# Patient Record
Sex: Female | Born: 1935 | Race: White | Hispanic: No | Smoking: Never smoker
Health system: Southern US, Community
[De-identification: ages and names within clinical notes are randomized; demographics above are authoritative.]

## PROBLEM LIST (undated history)

## (undated) DIAGNOSIS — H3530 Unspecified macular degeneration: Secondary | ICD-10-CM

## (undated) DIAGNOSIS — K5900 Constipation, unspecified: Secondary | ICD-10-CM

## (undated) DIAGNOSIS — E119 Type 2 diabetes mellitus without complications: Secondary | ICD-10-CM

## (undated) DIAGNOSIS — K219 Gastro-esophageal reflux disease without esophagitis: Secondary | ICD-10-CM

## (undated) DIAGNOSIS — E039 Hypothyroidism, unspecified: Secondary | ICD-10-CM

## (undated) HISTORY — PX: CYSTOCELE REPAIR: SHX163

## (undated) HISTORY — DX: Constipation, unspecified: K59.00

## (undated) HISTORY — PX: TOTAL ABDOMINAL HYSTERECTOMY: SHX209

## (undated) HISTORY — DX: Hypothyroidism, unspecified: E03.9

## (undated) HISTORY — PX: HIATAL HERNIA REPAIR: SHX195

## (undated) HISTORY — PX: OTHER SURGICAL HISTORY: SHX169

## (undated) HISTORY — DX: Gastro-esophageal reflux disease without esophagitis: K21.9

## (undated) HISTORY — DX: Type 2 diabetes mellitus without complications: E11.9

## (undated) HISTORY — DX: Unspecified macular degeneration: H35.30

---

## 2000-02-25 ENCOUNTER — Ambulatory Visit (HOSPITAL_COMMUNITY): Admission: RE | Admit: 2000-02-25 | Discharge: 2000-02-25 | Payer: Self-pay | Admitting: Neurology

## 2000-02-25 ENCOUNTER — Encounter: Payer: Self-pay | Admitting: Neurology

## 2000-02-29 ENCOUNTER — Ambulatory Visit (HOSPITAL_COMMUNITY): Admission: RE | Admit: 2000-02-29 | Discharge: 2000-02-29 | Payer: Self-pay | Admitting: Neurology

## 2000-03-08 ENCOUNTER — Inpatient Hospital Stay (HOSPITAL_COMMUNITY): Admission: AD | Admit: 2000-03-08 | Discharge: 2000-03-10 | Payer: Self-pay | Admitting: Cardiovascular Disease

## 2000-04-24 ENCOUNTER — Encounter (HOSPITAL_COMMUNITY): Admission: RE | Admit: 2000-04-24 | Discharge: 2000-05-24 | Payer: Self-pay | Admitting: Otolaryngology

## 2000-10-17 ENCOUNTER — Encounter: Payer: Self-pay | Admitting: Gastroenterology

## 2000-10-17 ENCOUNTER — Encounter: Admission: RE | Admit: 2000-10-17 | Discharge: 2000-10-17 | Payer: Self-pay | Admitting: Gastroenterology

## 2004-04-22 ENCOUNTER — Inpatient Hospital Stay (HOSPITAL_COMMUNITY): Admission: AD | Admit: 2004-04-22 | Discharge: 2004-04-25 | Payer: Self-pay | Admitting: Neurology

## 2005-10-23 ENCOUNTER — Encounter: Admission: RE | Admit: 2005-10-23 | Discharge: 2005-10-23 | Payer: Self-pay | Admitting: Endocrinology

## 2005-10-23 ENCOUNTER — Other Ambulatory Visit: Admission: RE | Admit: 2005-10-23 | Discharge: 2005-10-23 | Payer: Self-pay | Admitting: Interventional Radiology

## 2013-05-14 ENCOUNTER — Ambulatory Visit (INDEPENDENT_AMBULATORY_CARE_PROVIDER_SITE_OTHER): Payer: Medicare Other | Admitting: Internal Medicine

## 2013-05-14 ENCOUNTER — Encounter (INDEPENDENT_AMBULATORY_CARE_PROVIDER_SITE_OTHER): Payer: Self-pay | Admitting: Internal Medicine

## 2013-05-14 VITALS — BP 130/78 | HR 80 | Temp 98.1°F | Ht 66.0 in | Wt 146.0 lb

## 2013-05-14 DIAGNOSIS — I10 Essential (primary) hypertension: Secondary | ICD-10-CM | POA: Insufficient documentation

## 2013-05-14 DIAGNOSIS — R1310 Dysphagia, unspecified: Secondary | ICD-10-CM

## 2013-05-14 DIAGNOSIS — E039 Hypothyroidism, unspecified: Secondary | ICD-10-CM | POA: Insufficient documentation

## 2013-05-14 NOTE — Patient Instructions (Signed)
Modified barium swallow

## 2013-05-14 NOTE — Progress Notes (Signed)
Subjective:     Patient ID: Christina Kemp, female   DOB: 10-20-1919, 80 y.o.   MRN: [id number]  HPI Referred to our office for dysphagia, Christina Kemp,ANP.  Patient is a resident of [place]. Hiatal hernia repair by Dr. Terri PiedraLupton. X 2. Last surgery in the 60s. Her daughter tells me she has several EGD/EDs before her hiatal hernia. She was advised never to have another EGD.  She tells me her swallowing for the most part is okay. She cannot eat tough meat. She has her meats cut up in small portions. She has trouble with meats and potatoes. She does not have any trouble with bread unless she eats it with meats. Her HOB is elevated. She does occasionally have acid reflux. She avoid foods with acid.  She is on Reglan and Prilosec  Miralax 1 scoop daily.   Review of Systems Past Medical History  Diagnosis Date  . Diabetes   . Hypothyroid   . GERD (gastroesophageal reflux disease)   . Constipation     Past Surgical History  Procedure Laterality Date  . Surgery rt arm from a sarcoma    . Total abdominal hysterectomy      fibroids  . Cystocele repair    . Hiatal hernia repair      x 2    Allergies  Allergen Reactions  . Penicillins     No current outpatient prescriptions on file prior to visit.   No current facility-administered medications on file prior to visit.   Widowed. Worked in [employer]. Two children. One has DM, CAD. One in good health.      Objective:   Physical Exam weight 140 stated. I was unable to stand patient. She is wheel chair bound. Patient examined from wheelchair also.    The risks and benefits such as perforation, bleeding, and infection were reviewed with the patient and is agreeable. Assessment:     Dysphagia. She know what foods to avoid.  Motility problem needs to be ruled out.     Plan:    Avoid meats. Modified barium swallow. Keep HOB elevated. GERD diet.

## 2013-06-09 ENCOUNTER — Ambulatory Visit (HOSPITAL_COMMUNITY)
Admission: RE | Admit: 2013-06-09 | Discharge: 2013-06-09 | Disposition: A | Discharge status: Home / Self Care | Payer: Medicare Other | Source: Ambulatory Visit | Attending: Internal Medicine | Admitting: Internal Medicine

## 2013-06-09 ENCOUNTER — Other Ambulatory Visit (HOSPITAL_COMMUNITY): Payer: Medicare Other

## 2013-06-09 NOTE — Therapy (Signed)
SPEECH PATHOLOGY  Pt did not show for her MBSS today in radiology. When I called her daughter, she reported that pt was currently admitted to [facility] for possible aspiration PNA. She will call to reschedule appointment as able.   Thank you,  Havery Moros, CCC-SLP [phone]

## 2013-11-16 ENCOUNTER — Inpatient Hospital Stay
Admission: RE | Admit: 2013-11-16 | Discharge: 2014-01-19 | Disposition: A | Discharge status: Home / Self Care | Payer: Medicare Other | Source: Ambulatory Visit | Attending: Internal Medicine | Admitting: Internal Medicine

## 2013-11-16 DIAGNOSIS — M79662 Pain in left lower leg: Secondary | ICD-10-CM

## 2013-11-16 DIAGNOSIS — R0989 Other specified symptoms and signs involving the circulatory and respiratory systems: Principal | ICD-10-CM

## 2013-11-16 DIAGNOSIS — R059 Cough, unspecified: Secondary | ICD-10-CM

## 2013-11-16 DIAGNOSIS — R52 Pain, unspecified: Secondary | ICD-10-CM

## 2013-11-16 DIAGNOSIS — R110 Nausea: Secondary | ICD-10-CM

## 2013-11-16 DIAGNOSIS — M79672 Pain in left foot: Secondary | ICD-10-CM

## 2013-11-16 DIAGNOSIS — R05 Cough: Secondary | ICD-10-CM

## 2013-11-16 DIAGNOSIS — R609 Edema, unspecified: Secondary | ICD-10-CM

## 2013-11-17 ENCOUNTER — Other Ambulatory Visit: Payer: Self-pay | Admitting: *Deleted

## 2013-11-17 MED ORDER — HYDROCODONE-ACETAMINOPHEN 10-325 MG PO TABS: 1.0000 | ORAL_TABLET | Freq: Four times a day (QID) | ORAL | Status: AC | PRN

## 2013-11-18 ENCOUNTER — Non-Acute Institutional Stay (SKILLED_NURSING_FACILITY): Payer: Medicare Other | Admitting: Internal Medicine

## 2013-11-18 DIAGNOSIS — E1121 Type 2 diabetes mellitus with diabetic nephropathy: Secondary | ICD-10-CM

## 2013-11-18 DIAGNOSIS — J189 Pneumonia, unspecified organism: Secondary | ICD-10-CM

## 2013-11-18 DIAGNOSIS — S72142D Displaced intertrochanteric fracture of left femur, subsequent encounter for closed fracture with routine healing: Secondary | ICD-10-CM

## 2013-11-20 LAB — GLUCOSE, CAPILLARY: GLUCOSE-CAPILLARY: 148 mg/dL — AB (ref 70–99)

## 2013-11-20 NOTE — Progress Notes (Addendum)
Patient ID: Christina Kemp, female   DOB: 25-Jun-1919, 80 y.o.   MRN: [id number]               HISTORY & PHYSICAL  DATE:  11/18/2013    FACILITY: [facility]    LEVEL OF CARE:   SNF   CHIEF COMPLAINT:  Admission to SNF, post [facility], 11/09/2013 through 11/16/2013.    HISTORY OF PRESENT ILLNESS:  This is a 80 year-old woman who fell from her low bed on the morning of admission.  She developed left hip pain and was noted to have a left capital head fracture.  She underwent ORIF and will be non-weightbearing for six weeks.    She was also felt to have a mild early case of pneumonia in the left upper lobe and also right lower lobe.    X-ray of the left shoulder was negative.    The patient tells me she lives at [facility] assisted living in [place], where she has been for four years.  She can only mobilize with a lap belt and somebody walking with her.  I suspect the rest of the time, she is in a wheelchair.  Her overall situation is complicated by a previous amputation in the 1990s of her right arm.    PAST MEDICAL HISTORY/PROBLEM LIST:    Type 2 diabetes.    Osteosarcoma of the right arm, requiring right arm amputation.    Osteoporosis.    Hypertension.    Chronic neuropathy.    Hiatal hernia.    Hypothyroidism.    Chronic rhinitis.    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    CURRENT MEDICATIONS:  Discharge medications include:      Meclizine 25  t.i.d. p.r.n.    ASA 81 a day.    Senna 8.6 b.i.d.    Metformin 500 b.i.d.    Hydrochlorothiazide 12.5 q.d.    KCl 10 mEq daily.    MiraLAX daily.    Gabapentin 300 daily.    Reglan 2.5  t.i.d.    Synthroid 25 daily.    Spiriva 18 daily.    Tylenol 1000 b.i.d.    Xanax 0.25 b.i.d.     Zantac 150 b.i.d.    SOCIAL HISTORY:   HOUSING:  Lives at an assisted living.   TOBACCO USE:   No smoking history.   FUNCTIONAL STATUS:  The patient states she does not ambulate  very well independently.  I am not exactly sure of her functional status.  She tells me that she has had a few falls, although I am unable to really quantify this.  It does not sound as though this was that recently.    FAMILY HISTORY:   FATHER:   Father had coronary artery disease.   SIBLINGS:  Brother has COPD.    REVIEW OF SYSTEMS:   HEENT:  The patient states she is blind in the right eye and does not see well in the left.   CHEST/RESPIRATORY:  No shortness of breath.  No cough.     CARDIAC:   No chest pain.    GI:  States she has not had a bowel movement in four days.  By review of [facility], I am able to see that she has had a history of hiatal hernia repair remotely.  She also has a history of dysphagia with several EGDs.  She was seen by GI in May 2015, at which time it was suggested  that she have aspiration precaution to avoid meats and to have a "GERD diet".  Upper GI series was suggested, although I do not see that that was done, at least not in the [facility] system.    PHYSICAL EXAMINATION:   GENERAL APPEARANCE:  Pleasant woman.  Alert, conversational.   HEENT:   EYES:  Indeed, I could not demonstrate any vision in the right eye.  She seems to do fairly well on the left.   CHEST/RESPIRATORY:  Clear air entry bilaterally.  No crackles or wheezes.   CARDIOVASCULAR:  CARDIAC:   Heart sounds are normal.  She appears to be euvolemic.   GASTROINTESTINAL:  ABDOMEN:   Somewhat distended.  Surgical scars.  No masses.  No tenderness.   LIVER/SPLEEN/KIDNEYS:  No liver, no spleen.   GENITOURINARY:  BLADDER:   Not enlarged.  There is no costovertebral angle tenderness.    MUSCULOSKELETAL:   EXTREMITIES:   BILATERAL LOWER EXTREMITIES:  Probable significant osteoarthritis of both knees.   CIRCULATION:  EDEMA/VARICOSITIES:  There are no signs of a DVT in either leg.   NEUROLOGICAL:    SENSATION/STRENGTH:  She has good strength in the right leg.   DEEP TENDON REFLEXES:  Reflexes are absent  at the knee jerks.   BALANCE/GAIT:  I did not attempt to mobilize her.  She is non-weightbearing on the left leg.    ASSESSMENT/PLAN:    Status post ORIF of her left capital head fracture.  Done by Dr. Chaney MallingMortenson.    Pneumonia of the left upper lobe and right lower lobe.  She was treated for this.  She does not come to us on antibiotics.  Everything appears to be stable here.    Type 2 diabetes with apparent neuropathy.  On Glucophage.  We will monitor her CBGs while she is here.  I do not see a hemoglobin A1c.    Significant gastroesophageal reflux history.  I will make the staff aware of this.  GI makes it sound as though she was on some form of diet modification, although I do not see that this was ordered before coming here.   There is reference to Protonix, although she comes to us on Zantac.  I am presuming that this is the reason for the Reglan.  There are no EPS problems currently.    Hypothyroidism.  On replacement.      Osteoporosis.  Certainly not a candidate for bisphosphonates.    Status post amputation of the right arm at the shoulder.  This will complicate her attempts at rehabilitation.    Not on anything meaningful for DVT prophylaxis.  I cannot see anything that precludes her here and I am going to start her on Xarelto.  I will stop her aspirin in the meantime.

## 2013-11-21 LAB — GLUCOSE, CAPILLARY
GLUCOSE-CAPILLARY: 132 mg/dL — AB (ref 70–99)
Glucose-Capillary: 146 mg/dL — ABNORMAL HIGH (ref 70–99)

## 2013-11-22 LAB — GLUCOSE, CAPILLARY
Glucose-Capillary: 117 mg/dL — ABNORMAL HIGH (ref 70–99)
Glucose-Capillary: 146 mg/dL — ABNORMAL HIGH (ref 70–99)

## 2013-11-23 LAB — GLUCOSE, CAPILLARY
GLUCOSE-CAPILLARY: 132 mg/dL — AB (ref 70–99)
GLUCOSE-CAPILLARY: 129 mg/dL — AB (ref 70–99)
Glucose-Capillary: 180 mg/dL — ABNORMAL HIGH (ref 70–99)

## 2013-11-24 LAB — GLUCOSE, CAPILLARY
GLUCOSE-CAPILLARY: 102 mg/dL — AB (ref 70–99)
Glucose-Capillary: 192 mg/dL — ABNORMAL HIGH (ref 70–99)

## 2013-11-25 ENCOUNTER — Encounter: Payer: Self-pay | Admitting: Internal Medicine

## 2013-11-25 ENCOUNTER — Non-Acute Institutional Stay (SKILLED_NURSING_FACILITY): Payer: Medicare Other | Admitting: Internal Medicine

## 2013-11-25 DIAGNOSIS — M25572 Pain in left ankle and joints of left foot: Secondary | ICD-10-CM

## 2013-11-25 DIAGNOSIS — M25579 Pain in unspecified ankle and joints of unspecified foot: Secondary | ICD-10-CM | POA: Insufficient documentation

## 2013-11-25 DIAGNOSIS — I10 Essential (primary) hypertension: Secondary | ICD-10-CM

## 2013-11-25 DIAGNOSIS — F411 Generalized anxiety disorder: Secondary | ICD-10-CM

## 2013-11-25 LAB — GLUCOSE, CAPILLARY
Glucose-Capillary: 120 mg/dL — ABNORMAL HIGH (ref 70–99)
Glucose-Capillary: 91 mg/dL (ref 70–99)

## 2013-11-25 NOTE — Progress Notes (Signed)
Patient ID: Christina Kemp, female   DOB: 1919-11-30, 80 y.o.   MRN: [id number] Patient ID: Christina Kemp, female   DOB: 1919-11-30, 80 y.o.   MRN: [id number]               HISTORY & PHYSICAL     FACILITY: [facility]    LEVEL OF CARE:   SNF  This is an acute visit   CHIEF COMPLAINT:  Cute visit secondary to left foot discomfort-anxiety issues    HISTORY OF PRESENT ILLNESS:  This is a 80 year-old woman who fell from her low bed on the morning of admission.  She developed left hip pain and was noted to have a left capital head fracture.  She underwent ORIF and will be non-weightbearing for six weeks.    She was also felt to have a mild early case of pneumonia in the left upper lobe and also right lower lobe.    X-ray of the left shoulder was negative.    The patient lives at [facility] in [place], where she has been for four years.  She can only mobilize with a lap belt and somebody walking with her.  .  Her overall situation is complicated by a previous amputation in the 1990s of her right arm  She does have a history of diabetes with neuropathy she is on Neurontin.  She has complained of some burning and pain of her left foot especially her heel-she says this is not totally new that she had this in the hospital and previous apparently.  Nursing staff has elevated the foot and this appears to help some and also placed a psych at night on it that appears to help-.  Patient also has a history of anxiety she is on 0.25 mg of Xanax twice a day when necessary including at night however apparently she had been up to 1 mg daily at bedtime previously and her daughter would like the Xanax to be increased nursing staff agrees with increase in the Xanax saying she is quite anxious at night-patient states this as well...  Her vital signs continued to be stable otherwise she has no acute complaints. Marland Kitchen.    PAST MEDICAL HISTORY/PROBLEM LIST:    Type 2 diabetes.    Osteosarcoma  of the right arm, requiring right arm amputation.    Osteoporosis.    Hypertension.    Chronic neuropathy.    Hiatal hernia.    Hypothyroidism.    Chronic rhinitis.    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    CURRENT MEDICATIONS:  Discharge medications include:      Meclizine 25  t.i.d. p.r.n.    ASA 81 a day.    Senna 8.6 b.i.d.    Metformin 500 b.i.d.    Hydrochlorothiazide 12.5 q.d.    KCl 10 mEq daily.    MiraLAX daily.    Gabapentin 300 daily.    Reglan 2.5  t.i.d.    Synthroid 25 daily.    Spiriva 18 daily.    Tylenol 1000 b.i.d.    Xanax 0.25 b.i.d.     Zantac 150 b.i.d.    SOCIAL HISTORY:   HOUSING:  Lives at an assisted living.   TOBACCO USE:   No smoking history.        FAMILY HISTORY:   FATHER:   Father had coronary artery disease.   SIBLINGS:  Brother has COPD.    REVIEW OF SYSTEMS General no complaints of fever  chills.    HEENT:  The patient states she is blind in the right eye and does not see well in the left.   CHEST/RESPIRATORY:  No shortness of breath.  No cough.     CARDIAC:   No chest pain.    GI:  she says her stomach feels a little upset after drinking supplements otherwise no complaints had complain of constipation previously   Musculoskeletal-does complain of some left foot heel pain occasionally some right foot pain but more so on the left as noted above   Neurologic does not complain of dizziness or headache does have a history of neuropathy.     PHYSICAL EXAMINATION: Temperature 97.6 pulse 79 respirations 20 blood pressure 134/64   GENERAL APPEARANCE:  Pleasant woman.  Alert, conversational. Lying comfortably in bed  HEENT:   EYES:  Appears to be blind in the right eye-left eye acuity appears grossly baseline   CHEST/RESPIRATORY:  Clear air entry bilaterally.  No crackles or wheezes.   CARDIOVASCULAR:  CARDIAC:   Heart sounds are normal.  She appears to be euvolemic.   GASTROINTESTINAL:  ABDOMEN:    Somewhat distended.  Surgical scars.  No masses.  No tenderness  bowel. Sounds are positive   LIVER/SPLEEN/KIDNEYS:  No liver, no spleen.   GENITOURINARY:  BLADDER:   Not enlarged.  There is no costovertebral angle tenderness.    MUSCULOSKELETAL:  --Surgical site left hip Steri-Strips in place I do not see any sign of infection erythema or drainage EXTREMITIES:   BILATERAL LOWER EXTREMITIES:  Probable significant osteoarthritis of both knees--I do not note any deformities of her feet bilaterally pedal pulses are intact bilaterally there is some tenderness to palpation of the left heel foot area possibly a minimal amount of edema-some tenderness to palpation of the area I do not see any open areas or sign of cellulitis on the heel.   Marland Kitchen.   NEUROLOGICAL:    SENSATION/STRENGTH:  She has good strength in the right leg-I do not see any lateralizing findings.  Touch sensation appears to be intact lower extremities bilaterally  Labs.  11/23/2013.  WBC 4.5 hemoglobin 10.8 platelets 286.  Sodium 138 potassium 3.5 BUN 15 creatinine 0.57.  Assessment and plan.  #1-history of left foot heel pain-we will order venous studies and arterial Dopplers as well to rule out circulatory issues-.--Continue to encourage foot elevation since this appears to help and protect the heel.--Also will obtain x-ray to rule out any bony pathology  2 anxiety-per chart review it appears patient had been on a higher dose of Xanax at night-at this point will increase Xanax to 0.5 mg daily at bedtime when necessary if this is not effective suspect we can go up she appears to be tolerating the Xanax without oversedation And nursing staff does state she appears to be quite anxious at night.  3 hypertension she is on hydrochlorothiazide---at this point appears stable --will check metabolic panel next week to ensure stability of electrolytes    [id number]       -.       .Marland Kitchen

## 2013-11-26 ENCOUNTER — Inpatient Hospital Stay (HOSPITAL_COMMUNITY)
Admit: 2013-11-26 | Discharge: 2013-11-26 | Disposition: A | Discharge status: Home / Self Care | Payer: Medicare Other | Attending: Internal Medicine | Admitting: Internal Medicine

## 2013-11-26 LAB — GLUCOSE, CAPILLARY: GLUCOSE-CAPILLARY: 144 mg/dL — AB (ref 70–99)

## 2013-11-27 ENCOUNTER — Non-Acute Institutional Stay (SKILLED_NURSING_FACILITY): Payer: Medicare Other | Admitting: Internal Medicine

## 2013-11-27 ENCOUNTER — Ambulatory Visit (HOSPITAL_COMMUNITY): Payer: No Typology Code available for payment source | Attending: Internal Medicine

## 2013-11-27 ENCOUNTER — Encounter: Payer: Self-pay | Admitting: Internal Medicine

## 2013-11-27 DIAGNOSIS — R0989 Other specified symptoms and signs involving the circulatory and respiratory systems: Secondary | ICD-10-CM | POA: Insufficient documentation

## 2013-11-27 DIAGNOSIS — M25572 Pain in left ankle and joints of left foot: Secondary | ICD-10-CM

## 2013-11-27 DIAGNOSIS — I10 Essential (primary) hypertension: Secondary | ICD-10-CM

## 2013-11-27 DIAGNOSIS — Z8701 Personal history of pneumonia (recurrent): Secondary | ICD-10-CM | POA: Insufficient documentation

## 2013-11-27 DIAGNOSIS — R0600 Dyspnea, unspecified: Secondary | ICD-10-CM | POA: Insufficient documentation

## 2013-11-27 IMAGING — CR DG CHEST 1V
1 series · 1 of 1 positions shown · non-contrast
Comparison: [DATE], [DATE], [DATE], [DATE] .

CLINICAL DATA: Chest congestion.

EXAM:
CHEST - 1 VIEW

[view not recorded]
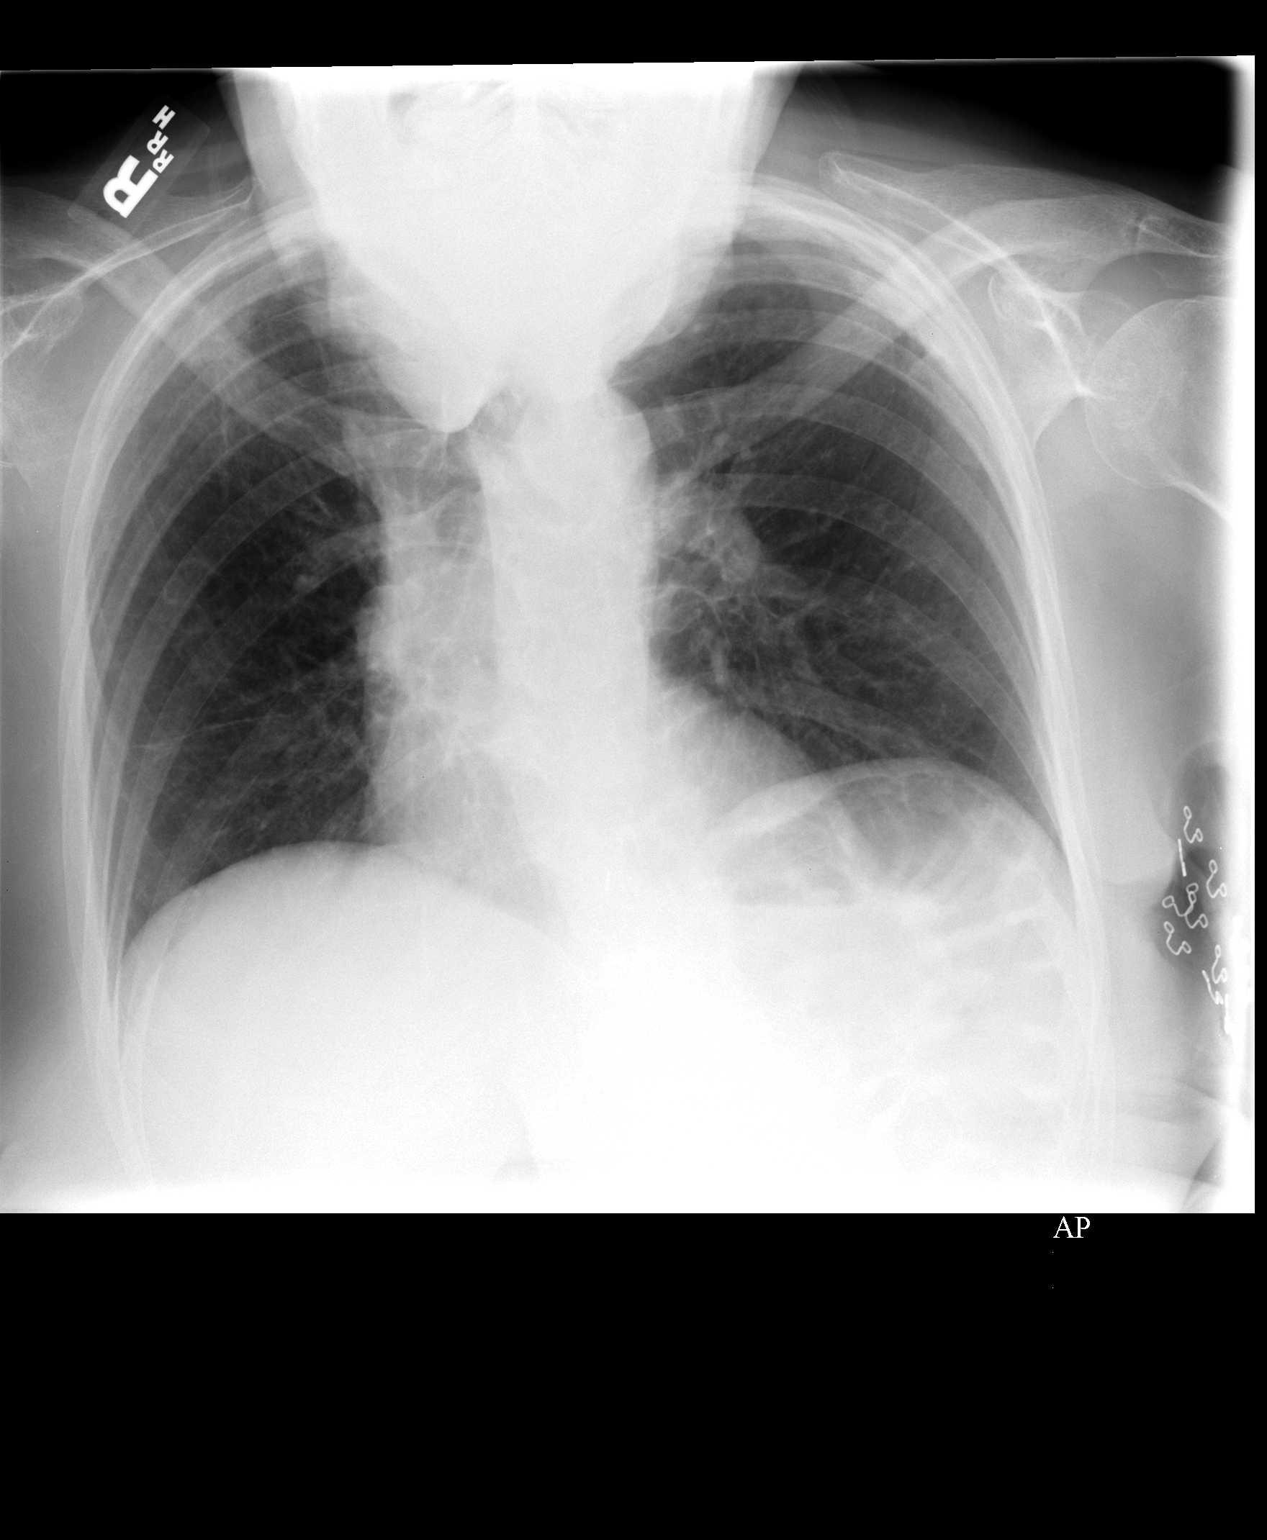

[1 of 1 positions shown; findings below may reference images not displayed]

FINDINGS: Mediastinum and hilar structures are unremarkable. Heart size
normal. Lungs are clear of acute infiltrates. Stable apical pleural
parenchymal thickening noted consistent with scarring . No acute
bony abnormality .
IMPRESSION: No active disease.

## 2013-11-27 NOTE — Progress Notes (Signed)
Patient ID: Nicola GirtLorene S Uppal, female   DOB: 1919-02-06, 80 y.o.   MRN: [id number] Patient ID: Nicola GirtLorene S Alessandrini, female   DOB: 1919-02-06, 80 y.o.   MRN: [id number] Patient ID: Nicola GirtLorene S Macleod, female   DOB: 1919-02-06, 80 y.o.   MRN: [id number]               HISTORY & PHYSICAL     FACILITY: [facility]    LEVEL OF CARE:   SNF  This is an acute visit   CHIEF COMPLAINT: Acute visit secondary to question shortness of breath --follow-up left heel pain  HISTORY OF PRESENT ILLNESS:  This is a 80 year-old woman who fell from her low bed on the morning of admission.  She developed left hip pain and was noted to have a left capital head fracture.  She underwent ORIF and will be non-weightbearing for six weeks.    She was also felt to have a mild early case of pneumonia in the left upper lobe and also right lower lobe.      She has complained of some burning and pain of her left foot especially her heel-she says this is not totally new that she had this in the hospital and previous apparently.  Nursing staff has elevated the foot and this appears to help--also ordered an x-ray which is come back negative for anything acute   Her daughter today thought she appear to be somewhat short of breath earlier this afternoon-I am following up on this-her vital signs appear to be stable as well as O2 saturation Apparently-- she has a cough as well.. Currently she does not complain of any shortness of breath. Marland Kitchen.    PAST MEDICAL HISTORY/PROBLEM LIST:    Type 2 diabetes.    Osteosarcoma of the right arm, requiring right arm amputation.    Osteoporosis.    Hypertension.    Chronic neuropathy.    Hiatal hernia.    Hypothyroidism.    Chronic rhinitis.    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    CURRENT MEDICATIONS:  Discharge medications include:      Meclizine 25  t.i.d. p.r.n.    ASA 81 a day.    Senna 8.6 b.i.d.    Metformin 500 b.i.d.    Hydrochlorothiazide 12.5 q.d.      KCl 10 mEq daily.    MiraLAX daily.    Gabapentin 300 daily.    Reglan 2.5  t.i.d.    Synthroid 25 daily.    Spiriva 18 daily.    Tylenol 1000 b.i.d.    Xanax 0.25 b.i.d.     Zantac 150 b.i.d.    SOCIAL HISTORY:   HOUSING:  Lives at an assisted living.   TOBACCO USE:   No smoking history.        FAMILY HISTORY:   FATHER:   Father had coronary artery disease.   SIBLINGS:  Brother has COPD.    REVIEW OF SYSTEMS General no complaints of fever chills.    HEENT:  The patient states she is blind in the right eye and does not see well in the left.   CHEST/RESPIRATORY:  No shortness of breath. Currently--has a cough at times-t daughter thought she was a bit short of breath earlier this afternoon.     CARDIAC:   No chest pain.    GI:  Not complaining of abdominal discomfort today  Musculoskeletal-does complain of some left foot heel pain occasionally  As noted above  Neurologic does not complain of dizziness or headache does have a history of neuropathy.     PHYSICAL EXAMINATION: Temperature 98.4 pulse 80 respirations 20 blood pressure 122/50 O2 saturation continues to be in the 90s on room air  GENERAL APPEARANCE:  Pleasant woman.  Alert, over sedation oh sitting in her wheelchair  HEENT:   EYES:  Appears to be blind in the right eye-left eye acuity appears grossly baseline   CHEST/RESPIRATORY:  No labored breathing possibly minimal crackles at the bases.   CARDIOVASCULAR:  CARDIAC:   Heart sounds are normal.  She appears to be euvolemic.   GASTROINTESTINAL:  ABDOMEN:   Somewhat distended.  Surgical scars.  No masses.  No tenderness  bowel. Sounds are positive       MUSCULOSKELETAL:  --- Really ambulating in a wheelchair I do not see any deformity she is status post left hip repair Left heel appears baseline with previous exam      .   NEUROLOGICAL:    SENSATION/STRENGTH:  She has good strength in the right leg-I do not see any lateralizing  findings--ambulatory and wheelchair currently  Labs.    11/23/2013.  WBC 4.5 hemoglobin 10.8 platelets 286.  Sodium 138 potassium 3.5 BUN 15 creatinine 0.57.  Assessment and plan.  #1-dyspnea-currently patient does not complain of shortness of breath-daughter says she appears more comfortable than earlier this afternoon-will order a chest x-ray for follow-up especially with her history of pneumonia also monitor virus signs pulse ox every shift for 48 hours. Also for cough will start Mucinex 600 milligrams twice a day for 5 days--in order a CBC with differential tomorrow  #2 history of left heel discomfort-she does not specifically complain of that today x-ray was negative we did also order Dopplers to study her circulation arterial and venous-will await those results but at this point does not appear to be in any distress.  #3 history of hypertension this appears relatively stable on hydrochlorothiazide will update a metabolic panel tomorrow     [phone]       -.       .Marland Kitchen

## 2013-11-28 LAB — GLUCOSE, CAPILLARY
GLUCOSE-CAPILLARY: 129 mg/dL — AB (ref 70–99)
Glucose-Capillary: 153 mg/dL — ABNORMAL HIGH (ref 70–99)
Glucose-Capillary: 141 mg/dL — ABNORMAL HIGH (ref 70–99)
Glucose-Capillary: 120 mg/dL — ABNORMAL HIGH (ref 70–99)
Glucose-Capillary: 134 mg/dL — ABNORMAL HIGH (ref 70–99)

## 2013-11-29 LAB — GLUCOSE, CAPILLARY
GLUCOSE-CAPILLARY: 138 mg/dL — AB (ref 70–99)
Glucose-Capillary: 115 mg/dL — ABNORMAL HIGH (ref 70–99)

## 2013-11-30 ENCOUNTER — Other Ambulatory Visit: Payer: Self-pay | Admitting: *Deleted

## 2013-11-30 LAB — GLUCOSE, CAPILLARY: Glucose-Capillary: 113 mg/dL — ABNORMAL HIGH (ref 70–99)

## 2013-11-30 MED ORDER — ALPRAZOLAM 0.5 MG PO TABS: ORAL_TABLET | ORAL | Status: AC

## 2013-11-30 MED ORDER — ALPRAZOLAM 0.25 MG PO TABS: ORAL_TABLET | ORAL | Status: AC

## 2013-11-30 NOTE — Telephone Encounter (Signed)
[facility] 

## 2013-12-01 LAB — GLUCOSE, CAPILLARY
Glucose-Capillary: 107 mg/dL — ABNORMAL HIGH (ref 70–99)
Glucose-Capillary: 103 mg/dL — ABNORMAL HIGH (ref 70–99)

## 2013-12-02 LAB — GLUCOSE, CAPILLARY
Glucose-Capillary: 103 mg/dL — ABNORMAL HIGH (ref 70–99)
Glucose-Capillary: 161 mg/dL — ABNORMAL HIGH (ref 70–99)

## 2013-12-03 ENCOUNTER — Ambulatory Visit (HOSPITAL_COMMUNITY): Payer: No Typology Code available for payment source | Attending: Internal Medicine

## 2013-12-03 DIAGNOSIS — R110 Nausea: Secondary | ICD-10-CM | POA: Insufficient documentation

## 2013-12-03 DIAGNOSIS — R630 Anorexia: Secondary | ICD-10-CM | POA: Insufficient documentation

## 2013-12-03 DIAGNOSIS — E119 Type 2 diabetes mellitus without complications: Secondary | ICD-10-CM | POA: Insufficient documentation

## 2013-12-03 LAB — GLUCOSE, CAPILLARY
GLUCOSE-CAPILLARY: 107 mg/dL — AB (ref 70–99)
Glucose-Capillary: 119 mg/dL — ABNORMAL HIGH (ref 70–99)

## 2013-12-04 ENCOUNTER — Non-Acute Institutional Stay (SKILLED_NURSING_FACILITY): Payer: Medicare Other | Admitting: Internal Medicine

## 2013-12-04 ENCOUNTER — Ambulatory Visit (HOSPITAL_COMMUNITY)
Admit: 2013-12-04 | Discharge: 2013-12-04 | Disposition: A | Discharge status: Home / Self Care | Payer: No Typology Code available for payment source | Attending: Internal Medicine | Admitting: Internal Medicine

## 2013-12-04 ENCOUNTER — Ambulatory Visit (HOSPITAL_COMMUNITY)
Admit: 2013-12-04 | Discharge: 2013-12-04 | Disposition: A | Discharge status: Home / Self Care | Payer: No Typology Code available for payment source | Source: Ambulatory Visit | Attending: Internal Medicine | Admitting: Internal Medicine

## 2013-12-04 DIAGNOSIS — M79672 Pain in left foot: Secondary | ICD-10-CM | POA: Insufficient documentation

## 2013-12-04 DIAGNOSIS — K449 Diaphragmatic hernia without obstruction or gangrene: Secondary | ICD-10-CM

## 2013-12-04 DIAGNOSIS — K219 Gastro-esophageal reflux disease without esophagitis: Secondary | ICD-10-CM

## 2013-12-04 DIAGNOSIS — R112 Nausea with vomiting, unspecified: Secondary | ICD-10-CM

## 2013-12-04 DIAGNOSIS — R1310 Dysphagia, unspecified: Secondary | ICD-10-CM

## 2013-12-04 LAB — GLUCOSE, CAPILLARY: GLUCOSE-CAPILLARY: 112 mg/dL — AB (ref 70–99)

## 2013-12-05 ENCOUNTER — Encounter: Payer: Self-pay | Admitting: Internal Medicine

## 2013-12-05 DIAGNOSIS — R112 Nausea with vomiting, unspecified: Secondary | ICD-10-CM | POA: Insufficient documentation

## 2013-12-05 DIAGNOSIS — K219 Gastro-esophageal reflux disease without esophagitis: Secondary | ICD-10-CM | POA: Insufficient documentation

## 2013-12-05 DIAGNOSIS — K449 Diaphragmatic hernia without obstruction or gangrene: Secondary | ICD-10-CM | POA: Insufficient documentation

## 2013-12-05 NOTE — Progress Notes (Signed)
Patient ID: Christina Kemp, female   DOB: September 16, 1919, 80 y.o.   MRN: [id number]    this is an acute visit.  Level care skilled.  Facility [facility].  Chief complaint-acute visit follow-up vomiting-GERD.  History of present illness.  Patient is a pleasant 80 year old female here for rehabilitation after sustaining a left hip fracture-she also was treated for apparent pneumonia in the hospital.  She has been relatively stable during her stay here-however apparently did have an episode of vomiting yesterday-apparently this has resolved-lab work was ordered which was fairly unremarkable-potassium is stable at 3.7 BUN 17 creatinine 0.7 to amylase and lipase were within normal limits.  Per chart review I do note she does have a history of hiatal hernia and patient confirms this-it appears she had come into the facility with some orders for Reglan although this is somewhat unclear-she is on Zantac twice a day-it appears at one point she possibly was on Prilosec.  Again the history on this her chart review is somewhat unclear.  Today patient has no acute complaints although she states she does have a very good appetite-she says she does doesn't have good taste sensation-she does not really specifically complain of dysphagia.  Her vital signs continued to be stable.  Family medical social history is been reviewed most recently progress note 11/22/2013.  Medications have been reviewed per MAR.  Review of systems.   in general does not complain of fever or chills.  Head ears eyes nose mouth and throat-does not complain of sore throat or visual changes she does have a history of blindness of her right eye.  Respiratory does not complain of cough or shortness of breath.  Cardiac does not complain of chest pain.  GI does have a history of hiatal hernia does not complain of abdominal discomfort however vomiting apparently has resolved does not complain of nausea.  GU does not complain of  dysuria.  Musculoskeletal does not complaining of joint pain she is status post right upper extremity amputation with history of sarcoma.  Neurologic does not complain of dizziness or headache.  Physical exam.  Temperature is 98.0 pulse 79 respirations 20 blood pressure 121/56.  General this is a pleasant elderly female in no distress sitting comfortably in her wheelchair.  Her skin is warm and dry.  Oropharynx is clear mucous membranes moist.--- Her throat is clear  Chest is clear to auscultation there is no labored breathing.  Heart is regular rate and rhythm without murmur gallop or rub.  Abdomen is soft nontender there are positive bowel sounds.  Musculoskeletal again is status post right arm amputation moves her other extremities at baseline-neurologic appears grossly intact her speech is clear.  Labs.  12/03/2013.  Sodium 137 potassium 3.7 BUN 17 creatinine 0.72.  Amylase 30-lipase 14.  11/28/2013.  WBC 4.1 hemoglobin 11.0 platelets 262.  Assessment and plan.  #1-history of vomiting-this appears to have resolved labs have been unremarkable at this point will monitor-says she is a somewhat poor appetite this will have to be monitored as well.  One would wonder possibly about some GERD etiology here she is on Zantac-I did try to contact her daughter via phone this evening but was unable to-at some point would like discuss her hiatal hernia history-as well as her history of using Reglan and a proton pump inhibitor-suspect at some point she may benefit from this but would like to discuss this when I get a bit more information from nursing staff and family.  Clinically she  does appear stable.  Will update a CBC and metabolic panel on Monday to ensure stability.  [id number].

## 2013-12-07 LAB — GLUCOSE, CAPILLARY
GLUCOSE-CAPILLARY: 124 mg/dL — AB (ref 70–99)
GLUCOSE-CAPILLARY: 125 mg/dL — AB (ref 70–99)
Glucose-Capillary: 163 mg/dL — ABNORMAL HIGH (ref 70–99)
Glucose-Capillary: 135 mg/dL — ABNORMAL HIGH (ref 70–99)
Glucose-Capillary: 117 mg/dL — ABNORMAL HIGH (ref 70–99)
Glucose-Capillary: 108 mg/dL — ABNORMAL HIGH (ref 70–99)
Glucose-Capillary: 154 mg/dL — ABNORMAL HIGH (ref 70–99)

## 2013-12-08 LAB — GLUCOSE, CAPILLARY
Glucose-Capillary: 94 mg/dL (ref 70–99)
Glucose-Capillary: 87 mg/dL (ref 70–99)

## 2013-12-09 LAB — GLUCOSE, CAPILLARY
Glucose-Capillary: 117 mg/dL — ABNORMAL HIGH (ref 70–99)
Glucose-Capillary: 117 mg/dL — ABNORMAL HIGH (ref 70–99)

## 2013-12-10 LAB — GLUCOSE, CAPILLARY
GLUCOSE-CAPILLARY: 132 mg/dL — AB (ref 70–99)
Glucose-Capillary: 120 mg/dL — ABNORMAL HIGH (ref 70–99)

## 2013-12-11 LAB — GLUCOSE, CAPILLARY
GLUCOSE-CAPILLARY: 138 mg/dL — AB (ref 70–99)
GLUCOSE-CAPILLARY: 148 mg/dL — AB (ref 70–99)

## 2013-12-12 LAB — GLUCOSE, CAPILLARY
Glucose-Capillary: 123 mg/dL — ABNORMAL HIGH (ref 70–99)
Glucose-Capillary: 125 mg/dL — ABNORMAL HIGH (ref 70–99)

## 2013-12-13 LAB — GLUCOSE, CAPILLARY
Glucose-Capillary: 119 mg/dL — ABNORMAL HIGH (ref 70–99)
Glucose-Capillary: 167 mg/dL — ABNORMAL HIGH (ref 70–99)

## 2013-12-14 LAB — GLUCOSE, CAPILLARY
GLUCOSE-CAPILLARY: 132 mg/dL — AB (ref 70–99)
Glucose-Capillary: 132 mg/dL — ABNORMAL HIGH (ref 70–99)

## 2013-12-15 LAB — GLUCOSE, CAPILLARY
GLUCOSE-CAPILLARY: 102 mg/dL — AB (ref 70–99)
Glucose-Capillary: 131 mg/dL — ABNORMAL HIGH (ref 70–99)

## 2013-12-16 LAB — GLUCOSE, CAPILLARY
Glucose-Capillary: 118 mg/dL — ABNORMAL HIGH (ref 70–99)
Glucose-Capillary: 107 mg/dL — ABNORMAL HIGH (ref 70–99)

## 2013-12-17 LAB — GLUCOSE, CAPILLARY
Glucose-Capillary: 111 mg/dL — ABNORMAL HIGH (ref 70–99)
Glucose-Capillary: 150 mg/dL — ABNORMAL HIGH (ref 70–99)

## 2013-12-18 LAB — GLUCOSE, CAPILLARY
GLUCOSE-CAPILLARY: 146 mg/dL — AB (ref 70–99)
Glucose-Capillary: 112 mg/dL — ABNORMAL HIGH (ref 70–99)
Glucose-Capillary: 164 mg/dL — ABNORMAL HIGH (ref 70–99)

## 2013-12-19 ENCOUNTER — Non-Acute Institutional Stay (SKILLED_NURSING_FACILITY): Payer: Medicare Other | Admitting: Internal Medicine

## 2013-12-19 DIAGNOSIS — E039 Hypothyroidism, unspecified: Secondary | ICD-10-CM

## 2013-12-19 DIAGNOSIS — R059 Cough, unspecified: Secondary | ICD-10-CM | POA: Insufficient documentation

## 2013-12-19 DIAGNOSIS — I10 Essential (primary) hypertension: Secondary | ICD-10-CM

## 2013-12-19 DIAGNOSIS — R05 Cough: Secondary | ICD-10-CM | POA: Insufficient documentation

## 2013-12-19 DIAGNOSIS — K219 Gastro-esophageal reflux disease without esophagitis: Secondary | ICD-10-CM

## 2013-12-19 DIAGNOSIS — E119 Type 2 diabetes mellitus without complications: Secondary | ICD-10-CM

## 2013-12-19 DIAGNOSIS — K449 Diaphragmatic hernia without obstruction or gangrene: Secondary | ICD-10-CM

## 2013-12-19 LAB — GLUCOSE, CAPILLARY
GLUCOSE-CAPILLARY: 171 mg/dL — AB (ref 70–99)
GLUCOSE-CAPILLARY: 97 mg/dL (ref 70–99)

## 2013-12-19 NOTE — Progress Notes (Signed)
Patient ID: Christina Kemp, female   DOB: [birth year], 80 y.o.   MRN: [id number] FACILITY: [facility]    LEVEL OF CARE:   SNF  This is an acute-routine visit   CHIEF COMPLAINT:  This is an acute visit secondary to cough-also medical management of chronic issues including recent left hip fracture-diabetes type 2-neuropathy-hypertension-history of hiatal hernia as well as hypothyroidism    HISTORY OF PRESENT ILLNESS:  This is a 80 year-old woman who fell from her low bed on the morning of  hospital admission.  She developed left hip pain and was noted to have a left capital head fracture.  She underwent ORIF and will be non-weightbearing for six weeks.    She was also felt to have a mild early case of pneumonia in the left upper lobe and also right lower lobe.  This was treated    The patient lived at [facility] assisted [facility] in [place], where she has been for four years.  She can only mobilize with a lap belt and somebody walking with her.  .  Her overall situation is complicated by a previous amputation in the 1990s of her right arm  She does have a history of diabetes with neuropathy she is on Neurontin. She is on Glucophage blood sugars appear to be stable ranging largely in the lower mid 100s in the morning as well as at bedtime-   -.  Patient also has a history of anxiety but this currently appears controlled on Xanax  Today her main complaint is a cough she says this is somewhat intermittent-does not appear to be overtly productive.  She also says she has no appetite recently-does not plan of any abdominal discomfort however she does have some history of significant GERD and actually is on Reglan as well as Zantac with a history of a hiatal hernia .    PAST MEDICAL HISTORY/PROBLEM LIST:    Type 2 diabetes.    Osteosarcoma of the right arm, requiring right arm amputation.    Osteoporosis.    Hypertension.    Chronic neuropathy.    Hiatal hernia.    Hypothyroidism.     Chronic rhinitis.    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    CURRENT MEDICATIONS:  Discharge medications include:      Meclizine 25  t.i.d. p.r.n.    ASA 81 a day.    Senna 8.6 b.i.d.    Metformin 500 b.i.d.    Hydrochlorothiazide 12.5 q.d.    KCl 20 mEq BID.    MiraLAX daily.    Gabapentin 300 daily.    Reglan 2.5  t.i.d.    Synthroid 25 daily.    Spiriva 18 daily.    Tylenol 1000 b.i.d.    Xanax 1 mg daily at bedtime when necessary anxiety     Zantac 150 b.i.d.    SOCIAL HISTORY:   HOUSING:  Lived at an assisted living.   TOBACCO USE:   No smoking history.        FAMILY HISTORY:   FATHER:   Father had coronary artery disease.   SIBLINGS:  Brother has COPD.    REVIEW OF SYSTEMS General no complaints of fever chills.    HEENT:  The patient states she is blind in the right eye and does not see well in the left--not complaining of sore throat.   CHEST/RESPIRATORY:  --Has a cough as noted above does not complaining of acute shortness of breath or really any increase  shortness of breath from baseline     CARDIAC:   No chest pain.    GI:  s History of GERD but she does not specifically complain of GERD-like symptoms burning tonight although says she just doesn't have much appetite lately  Musculoskeletal Does not complain of joint pain or foot pain this evening but has complained of some foot pain in the past  Neurologic does not complain of dizziness or headache does have a history of neuropathy.     PHYSICAL EXAMINATION: He is afebrile pulses 70 respirations 18 blood pressure recently 142/55-121/59-in this range  GENERAL APPEARANCE:  Pleasant woman.  Alert, conversational. Lying comfortably in bed  HEENT:   EYES:  Appears to be blind in the right eye-left eye acuity appears grossly baseline Oropharynx is clear mucous membranes moist   CHEST/RESPIRATORY: --Has somewhat scattered coarse breath sounds there is no labored breathing .    CARDIOVASCULAR:  CARDIAC:   Heart sounds are normal.  She appears to be euvolemic--trace lower extremity edema.   GASTROINTESTINAL:  ABDOMEN:   Somewhat distended.  Surgical scars.  No masses.  No tenderness  bowel. Sounds are positive   LIVER/SPLEEN/KIDNEYS:  No liver, no spleen.      MUSCULOSKELETAL:  -The mid exam since patient in bed but is able to move all extremities 3 has some generalized weakness especially lower extremities status post left hip fracture--she is status post right arm amputation EXTREMITIES:   BILATERAL LOWER EXTREMITIES:  Probable significant osteoarthritis of both knees--I do not note any deformities of her feet bilaterally  .   Marland Kitchen.   NEUROLOGICAL:    S This grossly intact moves her extremities 3-she is status post right arm amputation  Psych-is grossly alert and oriented pleasant and appropriate  Labs.  12/14/2013.  Sodium 140 potassium 3.4 BUN 13 creatinine 0.61.  12/07/2013.  WBC 4.4 hemoglobin 11.5 platelets 245.  Sodium 140 potassium 3.4 BUN 17 creatinine 0.63.  12/03/2013.  Liver function tests within normal limits except albumin of 3.4  11/23/2013.  WBC 4.5 hemoglobin 10.8 platelets 286.  Sodium 138 potassium 3.5 BUN 15 creatinine 0.57.  Assessment and plan.  #1-Cough-she does have some mild chest congestion-with a history of pneumonia-will update an x-ray-also will start Mucinex 600 mg twice a day for 5 days-monitor vital signs pulse ox every shift for 72 hours--she also has an inhaler when necessary Proventil  #2-diabetes type 2-this appears stable on Glucophage CBGs appear satisfactory largely in the low mid 100s.  #3-chronic neuropathy likely diabetic related she is on Neurontin apparently this is helping.  #4-history of hypothyroidism she is on Synthroid we'll update a TSH.  #5-history of left hip fracture this was surgically repaired this is followed by orthopedics at this point appears stable she is on Xarelto for  anticoagulation-also on Vicodin as needed for pain-- will be transitioned to aspirin for anticoagulation on December 20  #6-history osteoporosis-she is on calcium-with her history of significant GERD would not be a good candidate for Fosamax.  #7-history of GERD this is significant with a history of hiatal hernia-she is on Reglan-as well as Zantac.  #8-history of poor appetite-somewhat challenging at some point consider Remeron will write an order to monitor weights closely and notify provider of any weight loss-also will see what the chest x-ray tells us-if this persists consider an appetite stimulant-she does have a CBC and BMP scheduled for next laboratory day.     9 anxiety-apparently this is most significant at night-her Xanax was recently  increased to 1 mg daily at bedtime as needed-apparently this was her dose at home and she tolerated this well .  10 hypertension she is on hydrochlorothiazide---at this point appears stable --her potassium has running slightly low here supplementation as been increased-update BMP has been ordered    [id number]--of note greater than 35 minutes has been spent assessing patient-reviewing her medical records-and coordinating and formulating a plan of care for numerous diagnoses-of note greater than 50% of time spent coordinating plan of care       -.       Marland Kitchen

## 2013-12-20 ENCOUNTER — Inpatient Hospital Stay (HOSPITAL_COMMUNITY)
Admit: 2013-12-20 | Discharge: 2013-12-20 | Disposition: A | Discharge status: Home / Self Care | Payer: Medicare Other | Attending: Internal Medicine | Admitting: Internal Medicine

## 2013-12-20 DIAGNOSIS — R1310 Dysphagia, unspecified: Secondary | ICD-10-CM

## 2013-12-20 LAB — GLUCOSE, CAPILLARY
GLUCOSE-CAPILLARY: 107 mg/dL — AB (ref 70–99)
Glucose-Capillary: 142 mg/dL — ABNORMAL HIGH (ref 70–99)

## 2013-12-21 LAB — GLUCOSE, CAPILLARY
GLUCOSE-CAPILLARY: 131 mg/dL — AB (ref 70–99)
Glucose-Capillary: 121 mg/dL — ABNORMAL HIGH (ref 70–99)
Glucose-Capillary: 121 mg/dL — ABNORMAL HIGH (ref 70–99)

## 2013-12-22 LAB — GLUCOSE, CAPILLARY
Glucose-Capillary: 110 mg/dL — ABNORMAL HIGH (ref 70–99)
Glucose-Capillary: 127 mg/dL — ABNORMAL HIGH (ref 70–99)

## 2013-12-23 ENCOUNTER — Non-Acute Institutional Stay (SKILLED_NURSING_FACILITY): Payer: Medicare Other | Admitting: Internal Medicine

## 2013-12-23 ENCOUNTER — Encounter: Payer: Self-pay | Admitting: Internal Medicine

## 2013-12-23 DIAGNOSIS — N3281 Overactive bladder: Secondary | ICD-10-CM | POA: Insufficient documentation

## 2013-12-23 DIAGNOSIS — R630 Anorexia: Secondary | ICD-10-CM | POA: Insufficient documentation

## 2013-12-23 DIAGNOSIS — K1379 Other lesions of oral mucosa: Secondary | ICD-10-CM | POA: Insufficient documentation

## 2013-12-23 LAB — GLUCOSE, CAPILLARY
GLUCOSE-CAPILLARY: 173 mg/dL — AB (ref 70–99)
Glucose-Capillary: 89 mg/dL (ref 70–99)

## 2013-12-23 NOTE — Progress Notes (Signed)
Patient ID: Christina Kemp, female   DOB: 1919-01-02, 80 y.o.   MRN: [id number]   FACILITY: [facility]    LEVEL OF CARE:   SNF  This is an acute- visit   CHIEF COMPLAINT: Acute visit secondary to sore mouth-poor appetite- overactive bladder     HISTORY OF PRESENT ILLNESS:  This is a 80 year-old woman who fell from her low bed on the morning of  hospital admission.  She developed left hip pain and was noted to have a left capital head fracture.  She underwent ORIF and will be non-weightbearing for six weeks.    She was also felt to have a mild early case of pneumonia in the left upper lobe and also right lower lobe.  This was treated   \Both these issues appear to be stable.  No other patient is complaining of a sore mouth today think she has an ulcer on the left side of her mouth-also is noted what appears to be a small cyst lower right mouth area-she says she's had this before .  Patient also has a history apparently of overactive bladder had been on Sanctura at one point apparently at some point this was discontinued it appears possibly this happened in the transition to her recent hospitalization--although per chart review this appears to be somewhat unclear.  she stated in the past she did get relief from this-she does complain of bladder issues overnight with incontinence she says the Sanctura helped in the past  Patient also apparently has had a poor appetite-she says she does doesn't find anything appetizing-again apparently she had been on something to stimulate her appetite the past possibly Megace it was apparently a liquid solution-she does not complaining of any acute abdominal discomfort or difficulty swallowing-she does have a significant history of GERD is on Reglan for dysmotility issues and has been seen by GI with a swallowing study although I do not see those results  Again she says the issue basically she just doesn't feel like eating.  She did complain of a poor  appetite when I saw her last week-at that point she did have a cough ordered a chest x-ray which did not show any acute process --however her poor appetite is persisting apparently      .    PAST MEDICAL HISTORY/PROBLEM LIST:    Type 2 diabetes.    Osteosarcoma of the right arm, requiring right arm amputation.    Osteoporosis.    Hypertension.    Chronic neuropathy.    Hiatal hernia.    Hypothyroidism.    Chronic rhinitis.    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    CURRENT MEDICATIONS:  Discharge medications include:      Meclizine 25  t.i.d. p.r.n.    ASA 81 a day.    Senna 8.6 b.i.d.    Metformin 500 b.i.d.    Hydrochlorothiazide 12.5 q.d.    KCl 20 mEq BID.    MiraLAX daily.    Gabapentin 300 daily.    Reglan 2.5  t.i.d.    Synthroid 25 daily.    Spiriva 18 daily.    Tylenol 1000 b.i.d.    Xanax 1 mg daily at bedtime when necessary anxiety     Zantac 150 b.i.d.    SOCIAL HISTORY:   HOUSING:  Lived at an assisted living.   TOBACCO USE:   No smoking history.        FAMILY HISTORY:   FATHER:  Father had coronary artery disease.   SIBLINGS:  Brother has COPD.    REVIEW OF SYSTEMS General no complaints of fever chills.    HEENT:  The patient states she is blind in the right eye and does not see well in the left--not complaining of sore throat Mouth-does complain of some soreness left side of her mouth as well as right anterior mouth.   CHEST/RESPIRATORY:  -- Does not complain of shortness of breath or cough this afternoon    CARDIAC:   No chest pain.    GI:  s History of GERD but she does not specifically complain of GERD-like symptoms burning  although says she just doesn't have much appetite lately  Musculoskeletal Does not complain of joint pain or foot pain this evening but has complained of some foot pain in the past  Neurologic does not complain of dizziness or headache does have a history of neuropathy.      PHYSICAL EXAMINATION: Temperature 97.8 pulse 76 respirations 20 blood pressure 134/60--weight is 134-this appears loss of about 4 pounds the past month GENERAL APPEARANCE:  Pleasant woman.  Alert, conversational. Lying comfortably in bed  HEENT:   EYES:  Appears to be blind in the right eye-left eye acuity appears grossly baseline Oropharynx is clear mucous membranes moist--I note left side of mouth there appears to be possibly small ulcer.  I also note right side of mouth under the lip area there appears to be a small cystlike area slightly tender to touch She does have dentures   CHEST/RESPIRATORY: -Clear to auscultation with shallow air entry no labored breathing  .   CARDIOVASCULAR:  CARDIAC:   Heart sounds are normal.  She appears to be euvolemic--trace lower extremity edema.   GASTROINTESTINAL:  ABDOMEN:   Somewhat distended.  Surgical scars.  No masses.  No tenderness  bowel. Sounds are positive   LIVER/SPLEEN/KIDNEYS:  No liver, no spleen  GU-could not appreciate any suprapubic distention or acute tenderness could not appreciate any discharge.         .   .   NEUROLOGICAL:    S This grossly intact moves her extremities 3-she is status post right arm amputation  Psych-is grossly alert and oriented pleasant and appropriate  Labs.  This M 21st 2015.  WBC 3.9 hemoglobin 10.1 platelets 261.  Sodium 141 potassium 3.8 BUN 13 creatinine 0.5.    12/14/2013.  Sodium 140 potassium 3.4 BUN 13 creatinine 0.61.  12/07/2013.  WBC 4.4 hemoglobin 11.5 platelets 245.  Sodium 140 potassium 3.4 BUN 17 creatinine 0.63.  12/03/2013.  Liver function tests within normal limits except albumin of 3.4  11/23/2013.  WBC 4.5 hemoglobin 10.8 platelets 286.  Sodium 138 potassium 3.5 BUN 15 creatinine 0.57.  Assessment and plan.  #1-poor appetite-apparently this has been an intermittent situation the past-at this point will start low-dose Remeron 7.5 mill day and  monitor-appears she's lost about 4 pounds over the past month although unsure how precipitous this really is again orders have been written to monitor weights closely. Recent metabolic panel was relatively unremarkable will update this next week  #2-in regards to mouth sore-will treat with Magic mouthwash 5 mL 4 times a day  7 days.  In regards to the right lower mouth cyst-most likely mucoid-at this point will monitor again apply topical mouthwash for pain relief-this was discussed with Dr. Leanord Hawkingobson via phone   #3-overactive bladder-apparently she had been on Sanctura in the past in tolerated this well-unsure exactly why this  was discontinued --again this may have been in the transition to the hospital-will restart-apparently she has received significant relief from this-since this appears to be mainly at night will give her 20 mg daily at bedtime-  #4-anemia-hemoglobin appears to have dropped a bit Dr. Leanord Hawking has started iron with an update a CBC scheduled in approximately 2 weeks   [id number]           -.

## 2013-12-24 LAB — GLUCOSE, CAPILLARY
GLUCOSE-CAPILLARY: 91 mg/dL (ref 70–99)
GLUCOSE-CAPILLARY: 127 mg/dL — AB (ref 70–99)

## 2013-12-25 LAB — GLUCOSE, CAPILLARY
GLUCOSE-CAPILLARY: 113 mg/dL — AB (ref 70–99)
Glucose-Capillary: 99 mg/dL (ref 70–99)

## 2013-12-26 LAB — GLUCOSE, CAPILLARY
Glucose-Capillary: 101 mg/dL — ABNORMAL HIGH (ref 70–99)
Glucose-Capillary: 166 mg/dL — ABNORMAL HIGH (ref 70–99)

## 2013-12-27 LAB — GLUCOSE, CAPILLARY
Glucose-Capillary: 122 mg/dL — ABNORMAL HIGH (ref 70–99)
Glucose-Capillary: 150 mg/dL — ABNORMAL HIGH (ref 70–99)
Glucose-Capillary: 244 mg/dL — ABNORMAL HIGH (ref 70–99)

## 2013-12-28 LAB — GLUCOSE, CAPILLARY
GLUCOSE-CAPILLARY: 147 mg/dL — AB (ref 70–99)
Glucose-Capillary: 103 mg/dL — ABNORMAL HIGH (ref 70–99)

## 2013-12-29 LAB — GLUCOSE, CAPILLARY
GLUCOSE-CAPILLARY: 128 mg/dL — AB (ref 70–99)
GLUCOSE-CAPILLARY: 174 mg/dL — AB (ref 70–99)

## 2013-12-30 LAB — GLUCOSE, CAPILLARY
GLUCOSE-CAPILLARY: 164 mg/dL — AB (ref 70–99)
Glucose-Capillary: 105 mg/dL — ABNORMAL HIGH (ref 70–99)

## 2013-12-31 LAB — GLUCOSE, CAPILLARY
GLUCOSE-CAPILLARY: 121 mg/dL — AB (ref 70–99)
GLUCOSE-CAPILLARY: 118 mg/dL — AB (ref 70–99)

## 2014-01-01 LAB — GLUCOSE, CAPILLARY
GLUCOSE-CAPILLARY: 114 mg/dL — AB (ref 70–99)
Glucose-Capillary: 111 mg/dL — ABNORMAL HIGH (ref 70–99)

## 2014-01-02 ENCOUNTER — Non-Acute Institutional Stay (SKILLED_NURSING_FACILITY): Payer: Medicare Other | Admitting: Internal Medicine

## 2014-01-02 DIAGNOSIS — S81812A Laceration without foreign body, left lower leg, initial encounter: Secondary | ICD-10-CM

## 2014-01-02 DIAGNOSIS — D508 Other iron deficiency anemias: Secondary | ICD-10-CM

## 2014-01-02 LAB — GLUCOSE, CAPILLARY
GLUCOSE-CAPILLARY: 92 mg/dL (ref 70–99)
GLUCOSE-CAPILLARY: 128 mg/dL — AB (ref 70–99)

## 2014-01-02 NOTE — Progress Notes (Signed)
Patient ID: Christina Kemp, female   DOB: 05/19/1919, 81 y.o.   MRN: [id number]   This is an acute visit.  Level care skilled.  Facility [facility].  Chief complaint-acute visit secondary to left lower extremity skin tear-question cellulitis.  History of present illness.  Patient is a pleasant elderly resident here for rehabilitation after sustaining a left hip fracture that was surgically repaired.  She apparently had contact with the bed railing at some point and sustained a skin tear to her left lower leg-her daughter is concerned and has asked me to look at it.  Her daughter also would like her to have a tetanus since she says she is overdue.  Currently patient is not complaining of any significant pain in her leg.  Family medical social history as been reviewed including admission note on 11/18/2013.  Medications have been reviewed per MAR.  Review of systems.  In general no complaints of fever or chills.  Respiratory does not complain of shortness breath or cough.  Cardiac does not complain of chest pain.  Muscle skeletal-does have some weakness but does not really complain of significant acute pain here certainly.  Physical exam.  Temperature is 97.8 pulse 87 respirations 20 blood pressure 134/46.  In general is a pleasant elderly female in no distress resting comfortably in bed.  Her skin is warm and dry I do note left lower leg there appears to be a skin tear there is dried blood and bruising around it this extends y approximately 3 inches-there is a questionable mild erythema versus bruising around the site-Steri strips are in place there is some crusting and dry bleeding  Chest is clear to auscultation there is no labored breathing.  Heart is regular rate and rhythm without murmur gallop or rub she continues with some 1+ lower extremity edema which is relatively baseline.  Labs.  12/28/2013.  Sodium 140 potassium 4.2 BUN 18 creatinine  0.58.  12/21/2013.  WBC 3.9 hemoglobin 10.1 platelets 201.  Assessment and plan.  #1-skin tear-question cellulitis- this appears to be at this point to be somewhat significant skin tear-I suspect the risk of infection is fairly significant-will start doxycycline 100 mg twice a day for 5 days and monitor.  Also will order a tetanus shot as well--her daughter states she is overdue for this.  .  #2-anemia-she is on iron-will need to update CBC keep an eye on this also will update metabolic panel  [id number]

## 2014-01-03 LAB — GLUCOSE, CAPILLARY
GLUCOSE-CAPILLARY: 125 mg/dL — AB (ref 70–99)
Glucose-Capillary: 86 mg/dL (ref 70–99)

## 2014-01-04 ENCOUNTER — Encounter: Payer: Self-pay | Admitting: Internal Medicine

## 2014-01-04 DIAGNOSIS — S81812A Laceration without foreign body, left lower leg, initial encounter: Secondary | ICD-10-CM | POA: Insufficient documentation

## 2014-01-04 DIAGNOSIS — D508 Other iron deficiency anemias: Secondary | ICD-10-CM | POA: Insufficient documentation

## 2014-01-04 LAB — GLUCOSE, CAPILLARY
GLUCOSE-CAPILLARY: 164 mg/dL — AB (ref 70–99)
Glucose-Capillary: 114 mg/dL — ABNORMAL HIGH (ref 70–99)

## 2014-01-05 LAB — GLUCOSE, CAPILLARY
GLUCOSE-CAPILLARY: 121 mg/dL — AB (ref 70–99)
Glucose-Capillary: 109 mg/dL — ABNORMAL HIGH (ref 70–99)

## 2014-01-06 LAB — GLUCOSE, CAPILLARY
Glucose-Capillary: 97 mg/dL (ref 70–99)
Glucose-Capillary: 149 mg/dL — ABNORMAL HIGH (ref 70–99)

## 2014-01-07 LAB — GLUCOSE, CAPILLARY
Glucose-Capillary: 94 mg/dL (ref 70–99)
Glucose-Capillary: 119 mg/dL — ABNORMAL HIGH (ref 70–99)

## 2014-01-08 LAB — GLUCOSE, CAPILLARY
Glucose-Capillary: 105 mg/dL — ABNORMAL HIGH (ref 70–99)
Glucose-Capillary: 150 mg/dL — ABNORMAL HIGH (ref 70–99)

## 2014-01-09 LAB — GLUCOSE, CAPILLARY
Glucose-Capillary: 93 mg/dL (ref 70–99)
Glucose-Capillary: 136 mg/dL — ABNORMAL HIGH (ref 70–99)

## 2014-01-10 LAB — GLUCOSE, CAPILLARY
GLUCOSE-CAPILLARY: 114 mg/dL — AB (ref 70–99)
GLUCOSE-CAPILLARY: 142 mg/dL — AB (ref 70–99)
Glucose-Capillary: 96 mg/dL (ref 70–99)

## 2014-01-11 LAB — GLUCOSE, CAPILLARY
Glucose-Capillary: 80 mg/dL (ref 70–99)
Glucose-Capillary: 122 mg/dL — ABNORMAL HIGH (ref 70–99)

## 2014-01-12 LAB — GLUCOSE, CAPILLARY
Glucose-Capillary: 92 mg/dL (ref 70–99)
Glucose-Capillary: 132 mg/dL — ABNORMAL HIGH (ref 70–99)

## 2014-01-13 LAB — GLUCOSE, CAPILLARY
Glucose-Capillary: 98 mg/dL (ref 70–99)
Glucose-Capillary: 113 mg/dL — ABNORMAL HIGH (ref 70–99)

## 2014-01-14 LAB — GLUCOSE, CAPILLARY
Glucose-Capillary: 107 mg/dL — ABNORMAL HIGH (ref 70–99)
Glucose-Capillary: 161 mg/dL — ABNORMAL HIGH (ref 70–99)

## 2014-01-15 ENCOUNTER — Ambulatory Visit (HOSPITAL_COMMUNITY): Payer: No Typology Code available for payment source | Attending: Internal Medicine

## 2014-01-15 DIAGNOSIS — M79605 Pain in left leg: Secondary | ICD-10-CM | POA: Insufficient documentation

## 2014-01-15 LAB — GLUCOSE, CAPILLARY
GLUCOSE-CAPILLARY: 101 mg/dL — AB (ref 70–99)
GLUCOSE-CAPILLARY: 138 mg/dL — AB (ref 70–99)

## 2014-01-16 LAB — GLUCOSE, CAPILLARY
GLUCOSE-CAPILLARY: 96 mg/dL (ref 70–99)
GLUCOSE-CAPILLARY: 96 mg/dL (ref 70–99)

## 2014-01-17 LAB — GLUCOSE, CAPILLARY
Glucose-Capillary: 86 mg/dL (ref 70–99)
Glucose-Capillary: 107 mg/dL — ABNORMAL HIGH (ref 70–99)

## 2014-01-18 LAB — GLUCOSE, CAPILLARY
Glucose-Capillary: 80 mg/dL (ref 70–99)
Glucose-Capillary: 111 mg/dL — ABNORMAL HIGH (ref 70–99)

## 2014-01-19 LAB — GLUCOSE, CAPILLARY: Glucose-Capillary: 87 mg/dL (ref 70–99)

## 2014-09-02 DEATH — deceased
